# Patient Record
Sex: Male | Born: 1985 | Race: White | Hispanic: No | Marital: Married | Smoking: Never smoker
Health system: Southern US, Community
[De-identification: ages and names within clinical notes are randomized; demographics above are authoritative.]

---

## 2014-07-23 ENCOUNTER — Ambulatory Visit: Payer: Self-pay | Admitting: Physician Assistant

## 2017-07-06 ENCOUNTER — Ambulatory Visit
Admission: EM | Admit: 2017-07-06 | Discharge: 2017-07-06 | Disposition: A | Discharge status: Home / Self Care | Payer: Managed Care, Other (non HMO) | Attending: Emergency Medicine | Admitting: Emergency Medicine

## 2017-07-06 ENCOUNTER — Encounter: Payer: Self-pay | Admitting: Emergency Medicine

## 2017-07-06 DIAGNOSIS — J340 Abscess, furuncle and carbuncle of nose: Secondary | ICD-10-CM

## 2017-07-06 MED ORDER — DOXYCYCLINE HYCLATE 100 MG PO CAPS: 100.0000 mg | ORAL_CAPSULE | Freq: Two times a day (BID) | ORAL | 0 refills | Status: AC

## 2017-07-06 MED ORDER — IBUPROFEN 600 MG PO TABS: 600.0000 mg | ORAL_TABLET | Freq: Four times a day (QID) | ORAL | 0 refills | Status: DC | PRN

## 2017-07-06 NOTE — ED Triage Notes (Signed)
Patient c/o redness and swelling on the left side of his nose since Tuesday.

## 2017-07-06 NOTE — Discharge Instructions (Signed)
warm compresses, bacitracin, finish the doxycycline. ibuprofen 600 mg with 1 g of Tylenol as needed for pain.   Below is a list of primary care practices who are taking new patients for you to follow-up with. [facility] [place], [place] [address] [phone]  [facility] [phone] [place] [address]  [facility]: [place]  [phone]  [facility] [facility]: [place]   [phone]  [facility]: [address] [place]  [phone]  [facility] : [address] [place] [phone]  [facility]: [address] [place] [phone]  [facility]: [place] [phone]  Dr. Blanchie Serve [place]  [phone]  Dr. Benito Mccreedy, [facility]. [place], [place] [address]  [phone]  Go to www.goodrx.com to look up your medications. This will give you a list of where you can find your prescriptions at the most affordable prices. Or ask the pharmacist what the cash price is, or if they have any other discount programs available to help make your medication more affordable. This can be less expensive than what you would pay with insurance.

## 2017-07-06 NOTE — ED Provider Notes (Signed)
HPI  SUBJECTIVE:  Alec Bowman is a 31 y.o. male who presents with painful erythema and swelling on the left tip of his nose. Describes pain as sore, constant starting 2 days ago. States that it started off as a pimple on the inside of his nose. He reports left-sided headache. He tried ibuprofen 400 mg 3 times a day with some improvement in his symptoms. He is also try cleaning with peroxide. Symptoms are worse with palpation. No fevers, itching, burning, other rash inside his nose, trauma, insect bite. No drainage, crusting. No rash, vesicles. No symptoms like this previously. No antibiotics in the past month. Past medical history negative for MRSA, diabetes, HIV, immunocompromise. PMD: None.  History reviewed. No pertinent past medical history.  History reviewed. No pertinent surgical history.  History reviewed. No pertinent family history.  Social History  Substance Use Topics  . Smoking status: Never Smoker  . Smokeless tobacco: Never Used  . Alcohol use No    No current facility-administered medications for this encounter.   Current Outpatient Prescriptions:  .  doxycycline (VIBRAMYCIN) 100 MG capsule, Take 1 capsule (100 mg total) by mouth 2 (two) times daily., Disp: 10 capsule, Rfl: 0 .  ibuprofen (ADVIL,MOTRIN) 600 MG tablet, Take 1 tablet (600 mg total) by mouth every 6 (six) hours as needed., Disp: 30 tablet, Rfl: 0  Allergies  Allergen Reactions  . Sulfa Antibiotics Other (See Comments)    Unknown     ROS  As noted in HPI.   Physical Exam  BP 123/72 (BP Location: Right Arm)   Pulse 84   Temp 98.2 F (36.8 C) (Oral)   Resp 14   Ht 5\' 9"  (1.753 m)   Wt 185 lb (83.9 kg)   SpO2 99%   BMI 27.32 kg/m   Constitutional: Well developed, well nourished, no acute distress Eyes:  EOMI, conjunctiva normal bilaterally HENT: Normocephalic, atraumatic,mucus membranes moist Respiratory: Normal inspiratory effort Cardiovascular: Normal rate GI: nondistended skin:  Tender area of erythema, mild induration, no central fluctuance or pointing at the left nose. Positive scab with no expressible purulent drainage inside of the naris. See picture.     Musculoskeletal: no deformities Neurologic: Alert & oriented x 3, no focal neuro deficits Psychiatric: Speech and behavior appropriate   ED Course   Medications - No data to display  No orders of the defined types were placed in this encounter.   No results found for this or any previous visit (from the past 24 hour(s)). No results found.  ED Clinical Impression  Cellulitis of sidewall of nose   ED Assessment/Plan  Presentation consistent with a cellulitis of the nose although an ingrown hairs in the differential. Plan to do warm compresses, bacitracin, doxycycline 100 mg by mouth twice a day for 5 days, ibuprofen 600 mg with 1 g of Tylenol as needed for pain. He will return here if not getting any better, to the ER if he gets significantly worse. Also providing primary care referral.  Discussed MDM, plan and followup with patient . Discussed sn/sx that should prompt return to the ED. Patient agrees with plan.   Meds ordered this encounter  Medications  . ibuprofen (ADVIL,MOTRIN) 600 MG tablet    Sig: Take 1 tablet (600 mg total) by mouth every 6 (six) hours as needed.    Dispense:  30 tablet    Refill:  0  . doxycycline (VIBRAMYCIN) 100 MG capsule    Sig: Take 1 capsule (100 mg total) by  mouth 2 (two) times daily.    Dispense:  10 capsule    Refill:  0    *This clinic note was created using [employer]. Therefore, there may be occasional mistakes despite careful proofreading.  ?   Melynda Ripple, MD 07/06/17 [phone]

## 2017-10-04 ENCOUNTER — Emergency Department (HOSPITAL_COMMUNITY): Payer: Managed Care, Other (non HMO)

## 2017-10-04 ENCOUNTER — Encounter (HOSPITAL_COMMUNITY): Payer: Self-pay

## 2017-10-04 ENCOUNTER — Other Ambulatory Visit: Payer: Self-pay

## 2017-10-04 ENCOUNTER — Emergency Department (HOSPITAL_COMMUNITY)
Admission: EM | Admit: 2017-10-04 | Discharge: 2017-10-04 | Disposition: A | Discharge status: Home / Self Care | Payer: Managed Care, Other (non HMO) | Attending: Emergency Medicine | Admitting: Emergency Medicine

## 2017-10-04 DIAGNOSIS — Z79899 Other long term (current) drug therapy: Secondary | ICD-10-CM | POA: Insufficient documentation

## 2017-10-04 DIAGNOSIS — R55 Syncope and collapse: Secondary | ICD-10-CM | POA: Insufficient documentation

## 2017-10-04 DIAGNOSIS — S93401A Sprain of unspecified ligament of right ankle, initial encounter: Secondary | ICD-10-CM | POA: Insufficient documentation

## 2017-10-04 DIAGNOSIS — Y999 Unspecified external cause status: Secondary | ICD-10-CM | POA: Insufficient documentation

## 2017-10-04 DIAGNOSIS — Y929 Unspecified place or not applicable: Secondary | ICD-10-CM | POA: Diagnosis not present

## 2017-10-04 DIAGNOSIS — Y939 Activity, unspecified: Secondary | ICD-10-CM | POA: Diagnosis not present

## 2017-10-04 DIAGNOSIS — W109XXA Fall (on) (from) unspecified stairs and steps, initial encounter: Secondary | ICD-10-CM | POA: Insufficient documentation

## 2017-10-04 MED ORDER — IBUPROFEN 200 MG PO TABS
600.0000 mg | ORAL_TABLET | Freq: Once | ORAL | Status: AC
  Administered 2017-10-04: 600 mg via ORAL
  Filled 2017-10-04: qty 3

## 2017-10-04 NOTE — ED Triage Notes (Signed)
Patient BIB EMS from work with c/o right ankle pain s/p trip and fall down stairs. Patient reports rolling his ankle on the last step and "felt a pop" and lowered himself to the ground. Patient denies hitting his head, but reports feeling dizziness and having a momentary LOC prior to EMS arrival. EMS reports patient has been AxOx4 for them- patient has reported dizzy spells intermittently on the ride, but relieves itself when the patient is layed down. Patient VSS. Swelling noted to patient right ankle- ice applied in triage.

## 2017-10-04 NOTE — ED Provider Notes (Signed)
[facility] DEPT Provider Note   CSN: [id number] Arrival date & time: 10/04/17  1146     History   Chief Complaint Chief Complaint  Patient presents with  . Fall  . Ankle Pain    HPI Alec Bowman is a 31 y.o. male who presents to the ED via EMS with right ankle pain. Patient reports that he tripped on the last step going down stairs. In the process he rolled his right ankle and felt a pop. Patient denies head injury. When he felt the pop he felt weak and sat down feeling dizzy due to the pain and called 911. He reports that he did have a brief LOC prior to EMS arrival which he thinks was due to the pain. Patient states that other than the ankle he feels fine now.   HPI  History reviewed. No pertinent past medical history.  There are no active problems to display for this patient.   History reviewed. No pertinent surgical history.     Home Medications    Prior to Admission medications   Medication Sig Start Date End Date Taking? Authorizing Provider  ibuprofen (ADVIL,MOTRIN) 600 MG tablet Take 1 tablet (600 mg total) by mouth every 6 (six) hours as needed. 07/06/17   Melynda Ripple, MD    Family History History reviewed. No pertinent family history.  Social History Social History   Tobacco Use  . Smoking status: Never Smoker  . Smokeless tobacco: Never Used  Substance Use Topics  . Alcohol use: No  . Drug use: Not on file     Allergies   Sulfa antibiotics   Review of Systems Review of Systems  Constitutional: Negative for diaphoresis.  HENT: Negative.   Eyes: Negative for visual disturbance.  Respiratory: Negative for shortness of breath.   Cardiovascular: Negative for chest pain.  Gastrointestinal: Negative for abdominal pain, nausea and vomiting.  Genitourinary:       No loss of control of bladder or bowels.   Musculoskeletal: Positive for arthralgias and joint swelling.       Right ankle pain and swelling    Skin: Negative for wound.  Neurological: Positive for syncope and light-headedness. Negative for headaches.  Psychiatric/Behavioral: Negative for confusion. The patient is not nervous/anxious.      Physical Exam Updated Vital Signs BP 116/71 (BP Location: Right Arm)   Pulse 71   Temp 97.9 F (36.6 C) (Oral)   Resp 18   SpO2 100%   Physical Exam  Constitutional: He is oriented to person, place, and time. He appears well-developed and well-nourished. No distress.  HENT:  Head: Normocephalic.  Eyes: Conjunctivae and EOM are normal.  Neck: Normal range of motion. Neck supple.  Cardiovascular: Normal rate.  Pulmonary/Chest: Effort normal.  Musculoskeletal:       Right ankle: He exhibits swelling. He exhibits normal range of motion, no deformity, no laceration and normal pulse. Tenderness. Lateral malleolus tenderness found. Achilles tendon normal.  Pedal pulse 2+, adequate circulation, plantar and dorsi flexion without difficulty. Swelling and tenderness to the lateral aspect of the right ankle.   Neurological: He is alert and oriented to person, place, and time. No cranial nerve deficit.  Skin: Skin is warm and dry.  Psychiatric: He has a normal mood and affect. His behavior is normal.  Nursing note and vitals reviewed.    ED Treatments / Results  Labs (all labs ordered are listed, but only abnormal results are displayed) Labs Reviewed - No data to  display  EKG Radiology Dg Ankle Complete Right  Result Date: 10/04/2017 CLINICAL DATA:  The patient missed a step on stairs at work and injury of the right ankle. Considerable lateral pain and swelling. EXAM: RIGHT ANKLE - COMPLETE 3+ VIEW COMPARISON:  None in PACs FINDINGS: The bones are subjectively adequately mineralized. There is no acute fracture nor dislocation. The joint mortise is preserved and the talar dome is intact. The other hindfoot bones are normal. There is soft tissue swelling anteriorly and laterally. IMPRESSION:  No acute fracture or dislocation of the right ankle. Moderate amount of anterior and lateral soft tissue swelling. Electronically Signed   By: David  Martinique M.D.   On: 10/04/2017 13:18    Procedures Procedures (including critical care time)  Medications Ordered in ED Medications  ibuprofen (ADVIL,MOTRIN) tablet 600 mg (not administered)   I discussed this case with Dr. Lacinda Axon  Initial Impression / Assessment and Plan / ED Course  I have reviewed the triage vital signs and the nursing notes.  31 y.o. male with right ankle pain stable for d/c without focal neuro deficits and no fracture or dislocation noted on x-ray. ASO applied, crutches, ice elevation and ibuprofen. Patient states he does not want pain medication that he will take OTC.  Syncopal episode most likely related to the acute pain/injury. Patient without further symptoms at this time   Final Clinical Impressions(s) / ED Diagnoses   Final diagnoses:  Sprain of right ankle, unspecified ligament, initial encounter  Syncope, unspecified syncope type    ED Discharge Orders    None       Debroah Baller [place], [place] 10/04/17 [place]    Nat Christen, MD 10/05/17 1401

## 2017-10-04 NOTE — Discharge Instructions (Signed)
Follow up with your doctor.  Return here as needed 

## 2018-01-10 ENCOUNTER — Other Ambulatory Visit: Payer: Self-pay | Admitting: Internal Medicine

## 2018-01-10 DIAGNOSIS — R7989 Other specified abnormal findings of blood chemistry: Secondary | ICD-10-CM

## 2018-01-10 DIAGNOSIS — R945 Abnormal results of liver function studies: Secondary | ICD-10-CM

## 2018-01-16 ENCOUNTER — Ambulatory Visit: Payer: Managed Care, Other (non HMO)

## 2018-01-16 ENCOUNTER — Ambulatory Visit
Admission: RE | Admit: 2018-01-16 | Discharge: 2018-01-16 | Disposition: A | Discharge status: Home / Self Care | Payer: Managed Care, Other (non HMO) | Source: Ambulatory Visit | Attending: Internal Medicine | Admitting: Internal Medicine

## 2018-01-16 DIAGNOSIS — R7989 Other specified abnormal findings of blood chemistry: Secondary | ICD-10-CM

## 2018-01-16 DIAGNOSIS — R945 Abnormal results of liver function studies: Secondary | ICD-10-CM | POA: Diagnosis present

## 2018-01-18 ENCOUNTER — Other Ambulatory Visit: Payer: Self-pay | Admitting: Internal Medicine

## 2018-01-18 DIAGNOSIS — D1803 Hemangioma of intra-abdominal structures: Secondary | ICD-10-CM

## 2018-01-27 ENCOUNTER — Ambulatory Visit
Admission: RE | Admit: 2018-01-27 | Discharge: 2018-01-27 | Disposition: A | Discharge status: Home / Self Care | Payer: Managed Care, Other (non HMO) | Source: Ambulatory Visit | Attending: Internal Medicine | Admitting: Internal Medicine

## 2018-01-27 DIAGNOSIS — D1803 Hemangioma of intra-abdominal structures: Secondary | ICD-10-CM | POA: Diagnosis present

## 2018-01-27 MED ORDER — GADOBENATE DIMEGLUMINE 529 MG/ML IV SOLN
20.0000 mL | Freq: Once | INTRAVENOUS | Status: AC | PRN
  Administered 2018-01-27: 17 mL via INTRAVENOUS

## 2018-11-16 ENCOUNTER — Ambulatory Visit
Admission: EM | Admit: 2018-11-16 | Discharge: 2018-11-16 | Disposition: A | Discharge status: Home / Self Care | Payer: Managed Care, Other (non HMO) | Attending: Family Medicine | Admitting: Family Medicine

## 2018-11-16 ENCOUNTER — Encounter: Payer: Self-pay | Admitting: Gynecology

## 2018-11-16 ENCOUNTER — Other Ambulatory Visit: Payer: Self-pay

## 2018-11-16 DIAGNOSIS — H1032 Unspecified acute conjunctivitis, left eye: Secondary | ICD-10-CM | POA: Insufficient documentation

## 2018-11-16 MED ORDER — POLYMYXIN B-TRIMETHOPRIM 10000-0.1 UNIT/ML-% OP SOLN: 1.0000 [drp] | Freq: Four times a day (QID) | OPHTHALMIC | 0 refills | Status: AC

## 2018-11-16 NOTE — ED Triage Notes (Signed)
Patient c/o left eye redness / drainage/ swelling x this morning.

## 2018-11-16 NOTE — ED Provider Notes (Signed)
[facility]    CSN: [id number] Arrival date & time: 11/16/18  1015  History   Chief Complaint Chief Complaint  Patient presents with  . Conjunctivitis    HPI  33 year old male presents with concern for conjunctivitis.  Patient reports that his children have recently had pinkeye.  States that he woke up this morning with left eye redness, drainage, and swelling.  No visual disturbance.  No medications or interventions tried.  No known exacerbating or relieving factors.  Mild in severity.  No other complaints.  History reviewed and updated as below. PMH: Seasonal allergies  Surgical Hx: Wisdom teeth extraction.  Social History Social History   Tobacco Use  . Smoking status: Never Smoker  . Smokeless tobacco: Never Used  Substance Use Topics  . Alcohol use: No  . Drug use: Never    Allergies   Sulfa antibiotics   Review of Systems Review of Systems  Constitutional: Negative.   Eyes: Positive for discharge and redness. Negative for visual disturbance.   Physical Exam Triage Vital Signs ED Triage Vitals  Enc Vitals Group     BP 11/16/18 1035 128/86     Pulse Rate 11/16/18 1035 96     Resp 11/16/18 1035 16     Temp 11/16/18 1035 98.2 F (36.8 C)     Temp Source 11/16/18 1035 Oral     SpO2 11/16/18 1035 99 %     Weight 11/16/18 1033 180 lb (81.6 kg)     Height 11/16/18 1033 5\' 9"  (1.753 m)     Head Circumference --      Peak Flow --      Pain Score 11/16/18 1033 1     Pain Loc --      Pain Edu? --      Excl. in [place]? --     Updated Vital Signs BP 128/86 (BP Location: Left Arm)   Pulse 96   Temp 98.2 F (36.8 C) (Oral)   Resp 16   Ht 5\' 9"  (1.753 m)   Wt 81.6 kg   SpO2 99%   BMI 26.58 kg/m   Visual Acuity Right Eye Distance: 20/20 Left Eye Distance: 20/20 Bilateral Distance: 20/15(without corrective lens)  Right Eye Near:   Left Eye Near:    Bilateral Near:     Physical Exam Vitals signs and nursing note reviewed.    Constitutional:      General: He is not in acute distress. HENT:     Head: Normocephalic and atraumatic.     Nose: Nose normal.  Eyes:     Comments: Left eye with mild conjunctival injection.  No appreciable discharge at this time.  Cardiovascular:     Rate and Rhythm: Normal rate and regular rhythm.  Pulmonary:     Effort: Pulmonary effort is normal.     Breath sounds: No wheezing, rhonchi or rales.  Neurological:     Mental Status: He is alert.  Psychiatric:        Mood and Affect: Mood normal.        Behavior: Behavior normal.    UC Treatments / Results  Labs (all labs ordered are listed, but only abnormal results are displayed) Labs Reviewed - No data to display  EKG None  Radiology No results found.  Procedures Procedures (including critical care time)  Medications Ordered in UC Medications - No data to display  Initial Impression / Assessment and Plan / UC Course  I have reviewed the triage vital signs  and the nursing notes.  Pertinent labs & imaging results that were available during my care of the patient were reviewed by me and considered in my medical decision making (see chart for details).    34 year old male presents with conjunctivitis.  Treating with Polytrim.  Final Clinical Impressions(s) / UC Diagnoses   Final diagnoses:  Acute bacterial conjunctivitis of left eye   Discharge Instructions   None    ED Prescriptions    Medication Sig Dispense Auth. Provider   trimethoprim-polymyxin b (POLYTRIM) ophthalmic solution Place 1 drop into the left eye every 6 (six) hours for 5 days. 10 mL Coral Spikes, DO     Controlled Substance Prescriptions [place] Controlled Substance Registry consulted? Not Applicable   Coral Spikes, DO 11/16/18 1128

## 2019-11-19 IMAGING — US US ABDOMEN LIMITED
1 series · 14 of 25 positions shown · non-contrast
Comparison: None in PACs

CLINICAL DATA: Abnormal liver function studies

EXAM:
ULTRASOUND ABDOMEN LIMITED RIGHT UPPER QUADRANT

[Series 1: us abdomen limited · 0.17mm/px · 14 of 54 slices shown]
[im 1/54]
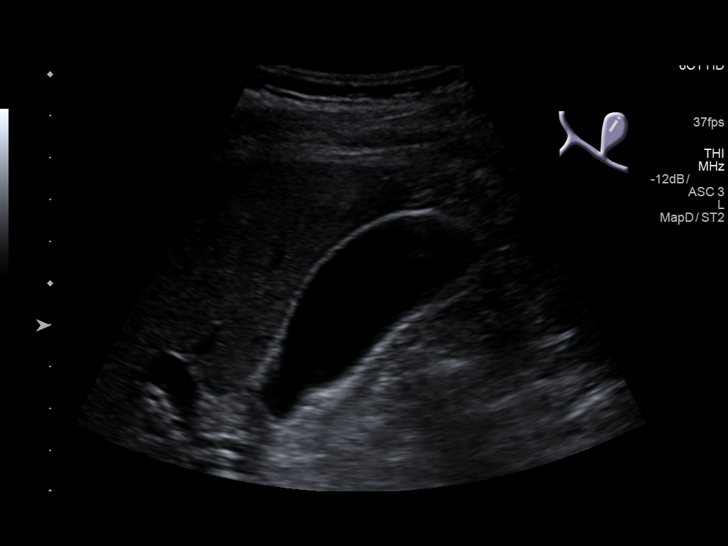
[im 5/54]
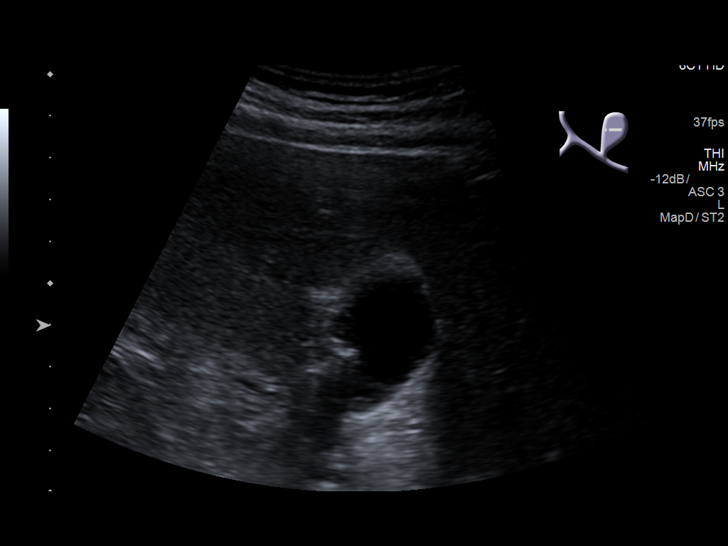
[im 9/54]
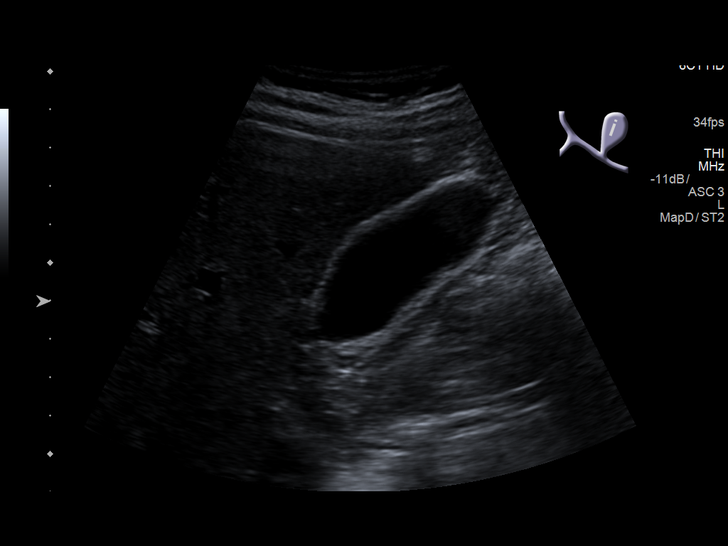
[im 14/54]
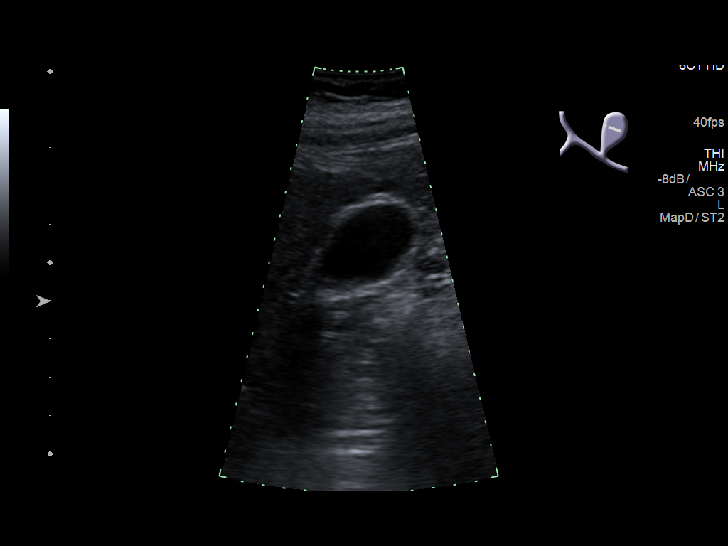
[im 18/54]
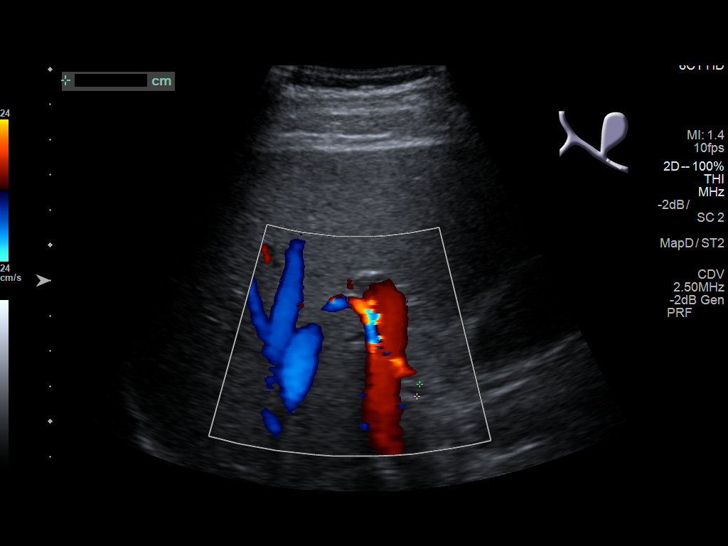
[im 20/54]
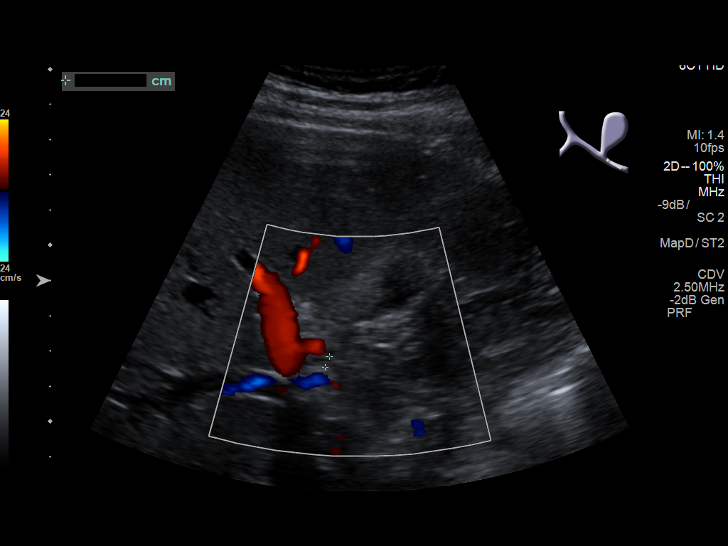
[im 25/54]
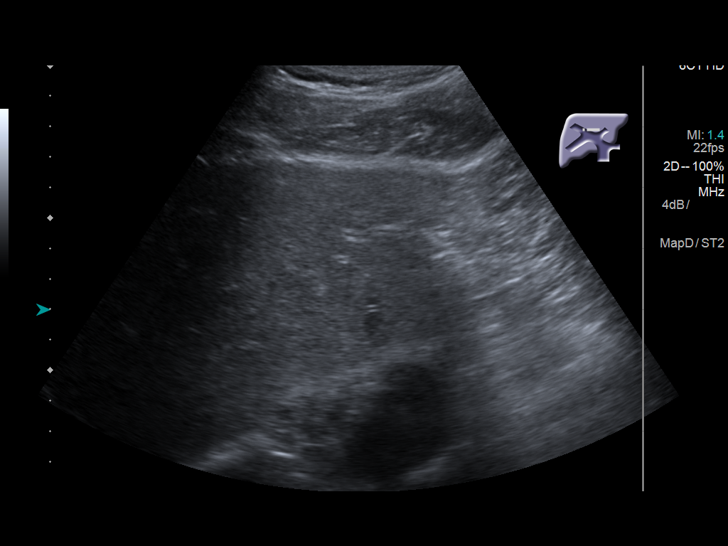
[im 29/54]
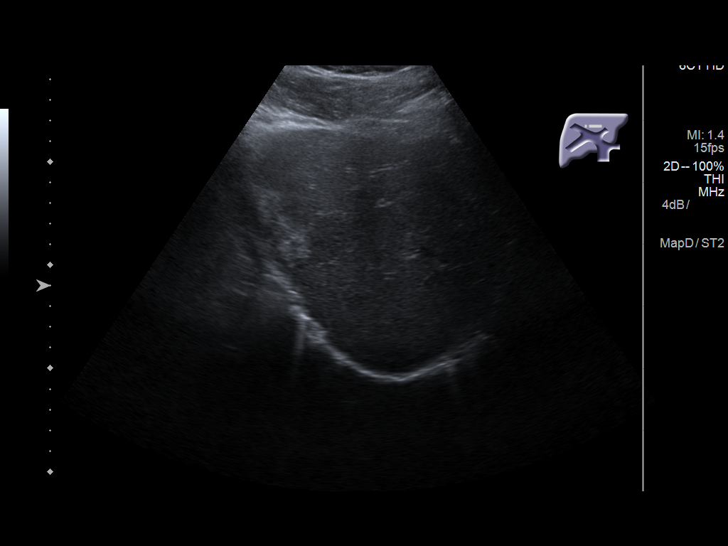
[im 34/54]
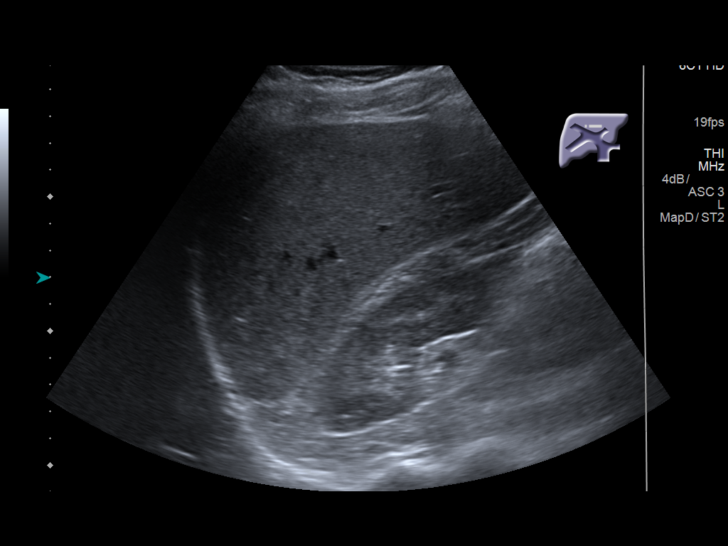
[im 36/54]
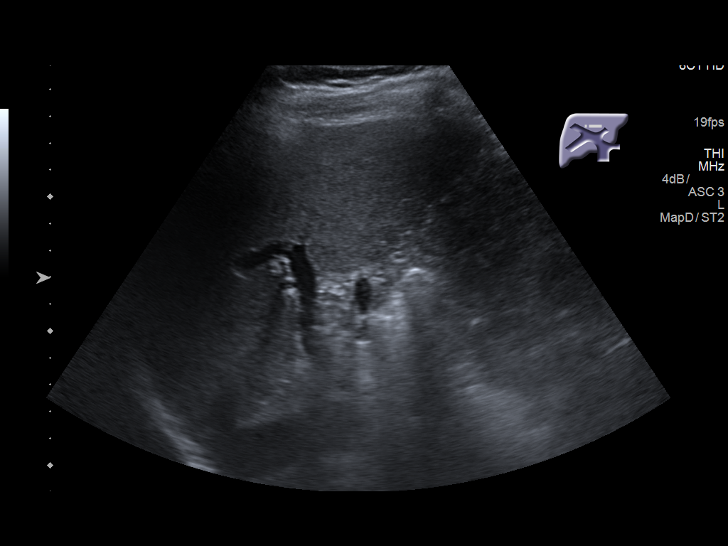
[im 40/54]
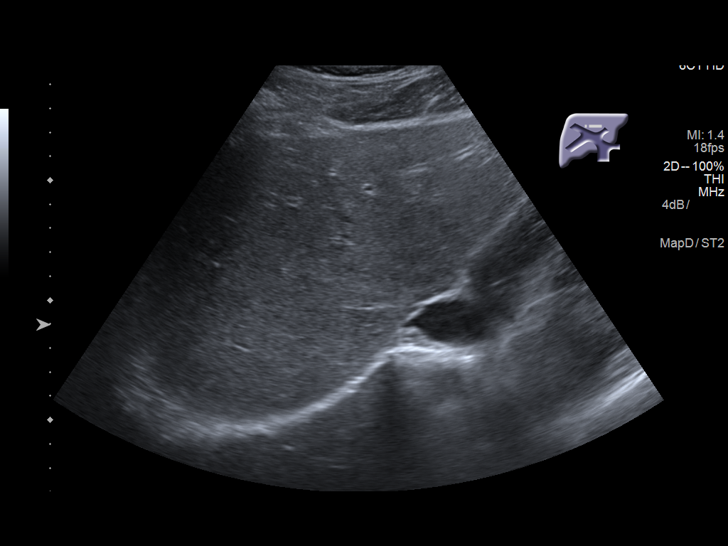
[im 45/54]
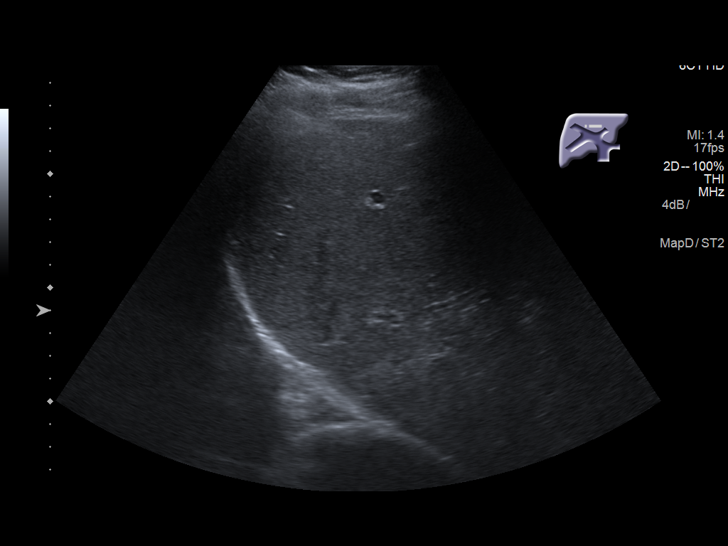
[im 49/54]
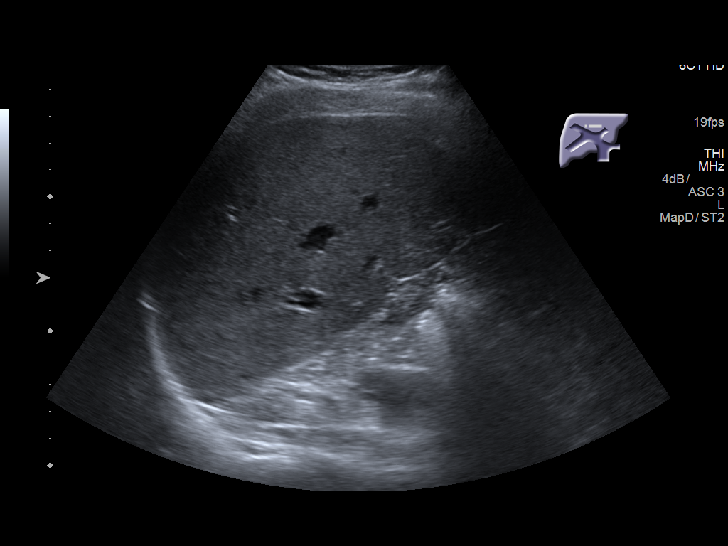
[im 54/54]
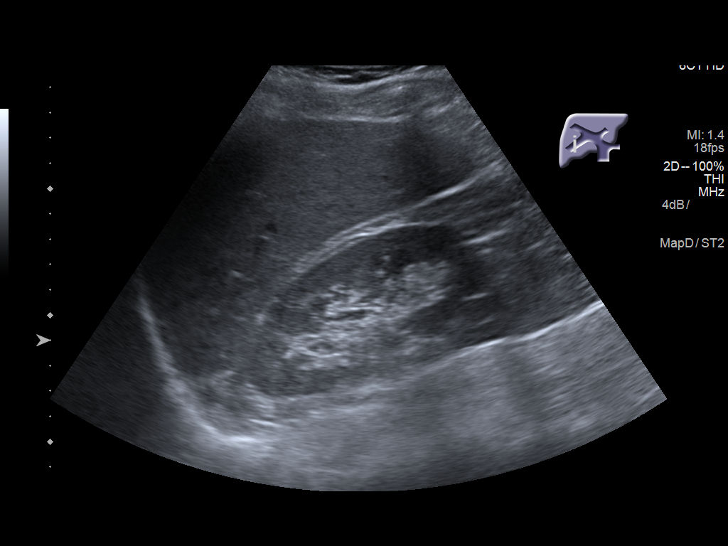

[14 of 25 positions shown; findings below may reference images not displayed]

FINDINGS: Gallbladder:

No gallstones or wall thickening visualized. No sonographic Murphy
sign noted by sonographer.

Common bile duct:

Diameter: 3.5 mm

Liver:

The hepatic echotexture overall appears normal. There is a slightly
hyperechoic focus in the right lobe measuring 1.9 cm in diameter.
This lies near the inferior capsular surface adjacent to the right
kidney and is most compatible with a hemangioma. There is no
intrahepatic ductal dilation. The surface contour of the liver is
smooth. Portal vein is patent on color Doppler imaging with normal
direction of blood flow towards the liver.
IMPRESSION: Normal appearance of the gallbladder and common bile duct.

Probable 1.9 cm diameter right lobe hepatic hemangioma. Given the
elevated liver function studies however, hepatic protocol MRI would
be useful to exclude primary or metastatic malignancy.
